# Patient Record
Sex: Male | Born: 1985 | Race: Black or African American | Hispanic: No | Marital: Single | Smoking: Former smoker
Health system: Southern US, Community
[De-identification: ages and names within clinical notes are randomized; demographics above are authoritative.]

---

## 2010-05-09 ENCOUNTER — Emergency Department (HOSPITAL_COMMUNITY)
Admission: EM | Admit: 2010-05-09 | Discharge: 2010-05-09 | Disposition: A | Discharge status: Other Acute Inpatient Hospital | Payer: Self-pay | Attending: Emergency Medicine | Admitting: Emergency Medicine

## 2010-05-09 DIAGNOSIS — F1420 Cocaine dependence, uncomplicated: Secondary | ICD-10-CM | POA: Insufficient documentation

## 2010-05-09 DIAGNOSIS — T71162A Asphyxiation due to hanging, intentional self-harm, initial encounter: Secondary | ICD-10-CM | POA: Insufficient documentation

## 2010-05-09 DIAGNOSIS — F489 Nonpsychotic mental disorder, unspecified: Secondary | ICD-10-CM | POA: Insufficient documentation

## 2010-05-09 LAB — CBC
HCT: 45.5 % (ref 39.0–52.0)
MCHC: 36.7 g/dL — ABNORMAL HIGH (ref 30.0–36.0)
MCV: 88.2 fL (ref 78.0–100.0)
RDW: 12.2 % (ref 11.5–15.5)

## 2010-05-09 LAB — RAPID URINE DRUG SCREEN, HOSP PERFORMED
Cocaine: POSITIVE — AB
Tetrahydrocannabinol: NOT DETECTED

## 2010-05-09 LAB — BASIC METABOLIC PANEL
BUN: 9 mg/dL (ref 6–23)
CO2: 28 mEq/L (ref 19–32)
Calcium: 9 mg/dL (ref 8.4–10.5)
Creatinine, Ser: 1.26 mg/dL (ref 0.4–1.5)
Glucose, Bld: 96 mg/dL (ref 70–99)

## 2010-05-09 LAB — DIFFERENTIAL
Eosinophils Relative: 0 % (ref 0–5)
Lymphocytes Relative: 26 % (ref 12–46)
Lymphs Abs: 2.1 10*3/uL (ref 0.7–4.0)
Monocytes Absolute: 0.5 10*3/uL (ref 0.1–1.0)

## 2010-07-13 ENCOUNTER — Inpatient Hospital Stay: Payer: Self-pay | Admitting: Unknown Physician Specialty

## 2018-01-15 ENCOUNTER — Other Ambulatory Visit: Payer: Self-pay

## 2018-01-15 ENCOUNTER — Emergency Department (HOSPITAL_COMMUNITY)
Admission: EM | Admit: 2018-01-15 | Discharge: 2018-01-15 | Disposition: A | Discharge status: Home / Self Care | Payer: Self-pay | Attending: Emergency Medicine | Admitting: Emergency Medicine

## 2018-01-15 ENCOUNTER — Encounter (HOSPITAL_COMMUNITY): Payer: Self-pay

## 2018-01-15 ENCOUNTER — Emergency Department (HOSPITAL_COMMUNITY): Payer: Self-pay

## 2018-01-15 DIAGNOSIS — F17290 Nicotine dependence, other tobacco product, uncomplicated: Secondary | ICD-10-CM | POA: Insufficient documentation

## 2018-01-15 DIAGNOSIS — M109 Gout, unspecified: Secondary | ICD-10-CM | POA: Insufficient documentation

## 2018-01-15 LAB — GRAM STAIN: Gram Stain: NONE SEEN

## 2018-01-15 LAB — CBC WITH DIFFERENTIAL/PLATELET
Abs Immature Granulocytes: 0.07 10*3/uL (ref 0.00–0.07)
BASOS ABS: 0 10*3/uL (ref 0.0–0.1)
BASOS PCT: 0 %
EOS ABS: 0 10*3/uL (ref 0.0–0.5)
Eosinophils Relative: 0 %
HCT: 42.4 % (ref 39.0–52.0)
Hemoglobin: 14.4 g/dL (ref 13.0–17.0)
IMMATURE GRANULOCYTES: 1 %
Lymphocytes Relative: 7 %
Lymphs Abs: 1 10*3/uL (ref 0.7–4.0)
MCH: 30.1 pg (ref 26.0–34.0)
MCHC: 34 g/dL (ref 30.0–36.0)
MCV: 88.5 fL (ref 80.0–100.0)
Monocytes Absolute: 1.3 10*3/uL — ABNORMAL HIGH (ref 0.1–1.0)
Monocytes Relative: 10 %
NEUTROS PCT: 82 %
NRBC: 0 % (ref 0.0–0.2)
Neutro Abs: 11 10*3/uL — ABNORMAL HIGH (ref 1.7–7.7)
PLATELETS: 242 10*3/uL (ref 150–400)
RBC: 4.79 MIL/uL (ref 4.22–5.81)
RDW: 12 % (ref 11.5–15.5)
WBC: 13.4 10*3/uL — AB (ref 4.0–10.5)

## 2018-01-15 LAB — BASIC METABOLIC PANEL
ANION GAP: 10 (ref 5–15)
BUN: 9 mg/dL (ref 6–20)
CALCIUM: 8.9 mg/dL (ref 8.9–10.3)
CO2: 26 mmol/L (ref 22–32)
Chloride: 97 mmol/L — ABNORMAL LOW (ref 98–111)
Creatinine, Ser: 1.02 mg/dL (ref 0.61–1.24)
GFR calc Af Amer: >60 mL/min (ref >60)
GLUCOSE: 194 mg/dL — AB (ref 70–99)
Potassium: 3.3 mmol/L — ABNORMAL LOW (ref 3.5–5.1)
SODIUM: 133 mmol/L — AB (ref 135–145)

## 2018-01-15 LAB — SYNOVIAL CELL COUNT + DIFF, W/ CRYSTALS
EOSINOPHILS-SYNOVIAL: 0 % (ref 0–1)
Lymphocytes-Synovial Fld: 2 % (ref 0–20)
MONOCYTE-MACROPHAGE-SYNOVIAL FLUID: 2 % — AB (ref 50–90)
Neutrophil, Synovial: 96 % — ABNORMAL HIGH (ref 0–25)
WBC, Synovial: 82410 /mm3 — ABNORMAL HIGH (ref 0–200)

## 2018-01-15 MED ORDER — LIDOCAINE HCL (PF) 2 % IJ SOLN
INTRAMUSCULAR | Status: AC
  Administered 2018-01-15: 5 mL
  Filled 2018-01-15: qty 10

## 2018-01-15 MED ORDER — COLCHICINE 0.6 MG PO TABS: 0.6000 mg | ORAL_TABLET | Freq: Four times a day (QID) | ORAL | 1 refills | Status: DC

## 2018-01-15 MED ORDER — PREDNISONE 50 MG PO TABS
60.0000 mg | ORAL_TABLET | Freq: Once | ORAL | Status: AC
  Administered 2018-01-15: 60 mg via ORAL
  Filled 2018-01-15: qty 1

## 2018-01-15 MED ORDER — PREDNISONE 20 MG PO TABS: 40.0000 mg | ORAL_TABLET | Freq: Every day | ORAL | 0 refills | Status: AC

## 2018-01-15 MED ORDER — SODIUM CHLORIDE 0.9 % IV BOLUS
1000.0000 mL | Freq: Once | INTRAVENOUS | Status: AC
  Administered 2018-01-15: 1000 mL via INTRAVENOUS

## 2018-01-15 MED ORDER — LIDOCAINE HCL (PF) 2 % IJ SOLN
2.0000 mL | Freq: Once | INTRAMUSCULAR | Status: DC
Start: 2018-01-15 — End: 2018-01-15

## 2018-01-15 NOTE — ED Provider Notes (Signed)
[facility] EMERGENCY DEPARTMENT Provider Note   CSN: [id number] Arrival date & time: 01/15/18  1007     History   Chief Complaint Chief Complaint  Patient presents with  . Knee Pain    HPI Tommy Diaz is a 32 y.o. male.  HPI  Patient presents with left knee pain.  Left knee pain began after buckling while at work 3 days ago.  Not initially swollen but a few hours later swell up with increased pain.  Some chills yesterday.  Swelling of the knee without other injury.  No chest pain.  No cough.  Found to have a fever here.  No sore throat.  No dysuria.  No sick contacts.  Denies penile discharge.  He is otherwise healthy.  History reviewed. No pertinent past medical history.  There are no active problems to display for this patient.   History reviewed. No pertinent surgical history.      Home Medications    Prior to Admission medications   Medication Sig Start Date End Date Taking? Authorizing Provider  acetaminophen (TYLENOL) 500 MG tablet Take 1,000 mg by mouth every 6 (six) hours as needed for mild pain or headache.   Yes [provider]  colchicine 0.6 MG tablet Take 1 tablet (0.6 mg total) by mouth 4 (four) times daily. 4 times a day until diarrhea then stop medication. 01/15/18   Benjiman Core, MD  predniSONE (DELTASONE) 20 MG tablet Take 2 tablets (40 mg total) by mouth daily. 01/16/18   Benjiman Core, MD    Family History No family history on file.  Social History Social History   Tobacco Use  . Smoking status: Current Every Day Smoker  . Smokeless tobacco: Current User    Types: Chew  Substance Use Topics  . Alcohol use: Yes    Comment: 4 - 8oz per week  . Drug use: Not Currently     Allergies   Patient has no known allergies.   Review of Systems Review of Systems  Constitutional: Positive for chills and fever. Negative for appetite change.  HENT: Negative for congestion and sore throat.   Respiratory: Negative for cough and  shortness of breath.   Cardiovascular: Negative for chest pain.  Gastrointestinal: Negative for abdominal pain, nausea and vomiting.  Genitourinary: Negative for flank pain.  Musculoskeletal: Positive for joint swelling. Negative for back pain.       Left knee pain  Neurological: Negative for weakness.     Physical Exam Updated Vital Signs BP (!) 153/92 (BP Location: Left Arm)   Pulse (!) 107   Temp (!) 101.7 F (38.7 C) (Oral)   Resp 20   Ht 6\' 1"  (1.854 m)   Wt 83.9 kg   SpO2 99%   BMI 24.41 kg/m   Physical Exam  Constitutional: He appears well-developed.  HENT:  Head: Atraumatic.  Mouth/Throat: No oropharyngeal exudate.  Eyes: Pupils are equal, round, and reactive to light.  Neck: Neck supple.  Cardiovascular:  Mild tachycardia  Pulmonary/Chest: Effort normal. He has no wheezes. He has no rales.  Abdominal: There is no tenderness.  Musculoskeletal: He exhibits tenderness.  Tenderness and swelling of the left knee.  Has effusion.  Has warmth without skin changes.  Decreased range of motion due to pain.  Somewhat irritable knee.  Neurological: He is alert.  Skin: Skin is warm.     ED Treatments / Results  Labs (all labs ordered are listed, but only abnormal results are displayed) Labs Reviewed  CBC  WITH DIFFERENTIAL/PLATELET - Abnormal; Notable for the following components:      Result Value   WBC 13.4 (*)    Neutro Abs 11.0 (*)    Monocytes Absolute 1.3 (*)    All other components within normal limits  BASIC METABOLIC PANEL - Abnormal; Notable for the following components:   Sodium 133 (*)    Potassium 3.3 (*)    Chloride 97 (*)    Glucose, Bld 194 (*)    All other components within normal limits  SYNOVIAL CELL COUNT + DIFF, W/ CRYSTALS - Abnormal; Notable for the following components:   Appearance-Synovial CLOUDY (*)    WBC, Synovial 82,410 (*)    Neutrophil, Synovial 96 (*)    Monocyte-Macrophage-Synovial Fluid 2 (*)    All other components within  normal limits  GRAM STAIN  CULTURE, BODY FLUID-BOTTLE    EKG None  Radiology Dg Chest 2 View  Result Date: 01/15/2018 CLINICAL DATA:  Left knee pain and swelling after an injury yesterday. The patient has unexplained fever and shortness of breath. Current smoker. EXAM: CHEST - 2 VIEW COMPARISON:  None. FINDINGS: The lungs are adequately inflated and clear. The heart and pulmonary vascularity are normal. The mediastinum is normal in width. There is no pleural effusion. The bony thorax exhibits no acute abnormality. IMPRESSION: There is no active cardiopulmonary disease. Electronically Signed   By: David  [place] M.D.   On: 01/15/2018 11:44   Dg Knee Complete 4 Views Left  Result Date: 01/15/2018 CLINICAL DATA:  Left knee pain and fever since an injury at work yesterday. EXAM: LEFT KNEE - COMPLETE 4+ VIEW COMPARISON:  None. FINDINGS: The bones are subjectively adequately mineralized. There is no acute fracture nor dislocation. There is a suprapatellar effusion. There is no high-grade joint space loss. There are no abnormal soft tissue calcifications. IMPRESSION: There is a suprapatellar effusion. There is no acute or significant chronic bony abnormality of the left knee. Electronically Signed   By: David  [place] M.D.   On: 01/15/2018 11:45    Procedures .Joint Aspiration/Arthrocentesis Date/Time: 01/15/2018 12:17 PM Performed by: Benjiman Core, MD Authorized by: Benjiman Core, MD   Consent:    Consent obtained:  Verbal   Consent given by:  Patient   Risks discussed:  Bleeding, infection, pain and incomplete drainage   Alternatives discussed:  No treatment and delayed treatment Location:    Location:  Knee   Knee:  L knee Anesthesia (see MAR for exact dosages):    Anesthesia method:  Local infiltration   Local anesthetic:  Lidocaine 2% w/o epi Procedure details:    Preparation: Patient was prepped and draped in usual sterile fashion     Needle gauge:  18 G   Ultrasound  guidance: yes     Approach:  Lateral   Aspirate amount:  23 cc   Aspirate characteristics:  Cloudy   Steroid injected: no     Specimen collected: yes   Post-procedure details:    Dressing:  Sterile dressing   Patient tolerance of procedure:  Tolerated well, no immediate complications   (including critical care time)  Medications Ordered in ED Medications  lidocaine (XYLOCAINE) 2 % injection 2 mL (2 mLs Other Not Given 01/15/18 1105)  sodium chloride 0.9 % bolus 1,000 mL (0 mLs Intravenous Stopped 01/15/18 1311)  lidocaine (XYLOCAINE) 2 % injection (5 mLs  Given 01/15/18 1105)  predniSONE (DELTASONE) tablet 60 mg (60 mg Oral Given 01/15/18 1318)     Initial Impression / Assessment  and Plan / ED Course  I have reviewed the triage vital signs and the nursing notes.  Pertinent labs & imaging results that were available during my care of the patient were reviewed by me and considered in my medical decision making (see chart for details).     Patient with fever.  Has apparent gout in left knee.  Discussed with Dr. Romeo Apple.  No organisms seen on Gram stain.  Likely gout causing the fever.  Will treat with steroids and colchicine.  Follow-up as an outpatient with Dr. Romeo Apple.  Culture has however been sent.  Final Clinical Impressions(s) / ED Diagnoses   Final diagnoses:  Acute gout of left knee, unspecified cause    ED Discharge Orders         Ordered    predniSONE (DELTASONE) 20 MG tablet  Daily     01/15/18 1259    colchicine 0.6 MG tablet  4 times daily     01/15/18 1259           Benjiman Core, MD 01/15/18 1537

## 2018-01-15 NOTE — ED Notes (Signed)
Pt had 23 cc of cloudy yellow fluid from his left knee by dr Carley Hammed

## 2018-01-15 NOTE — ED Triage Notes (Addendum)
Pt reports he was at work Friday and left knee buckled. Reports knee has been swollen and painful since. Pt noted to have fever in triage. Reports chills yesterday, but no other symptoms. Denies being around anyone sick

## 2018-01-20 LAB — CULTURE, BODY FLUID W GRAM STAIN -BOTTLE: Culture: NO GROWTH

## 2018-07-04 ENCOUNTER — Emergency Department (HOSPITAL_COMMUNITY)
Admission: EM | Admit: 2018-07-04 | Discharge: 2018-07-04 | Disposition: A | Discharge status: Home / Self Care | Payer: Self-pay | Attending: Emergency Medicine | Admitting: Emergency Medicine

## 2018-07-04 ENCOUNTER — Other Ambulatory Visit: Payer: Self-pay

## 2018-07-04 ENCOUNTER — Encounter (HOSPITAL_COMMUNITY): Payer: Self-pay | Admitting: Emergency Medicine

## 2018-07-04 DIAGNOSIS — Z7689 Persons encountering health services in other specified circumstances: Secondary | ICD-10-CM | POA: Insufficient documentation

## 2018-07-04 DIAGNOSIS — M545 Low back pain: Secondary | ICD-10-CM | POA: Insufficient documentation

## 2018-07-04 DIAGNOSIS — Z79899 Other long term (current) drug therapy: Secondary | ICD-10-CM | POA: Insufficient documentation

## 2018-07-04 DIAGNOSIS — Z87891 Personal history of nicotine dependence: Secondary | ICD-10-CM | POA: Insufficient documentation

## 2018-07-04 DIAGNOSIS — Z7982 Long term (current) use of aspirin: Secondary | ICD-10-CM | POA: Insufficient documentation

## 2018-07-04 NOTE — ED Triage Notes (Signed)
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

## 2018-07-04 NOTE — ED Provider Notes (Signed)
[facility] EMERGENCY DEPARTMENT Provider Note   CSN: [id number] Arrival date & time: 07/04/18  1455    History   Chief Complaint Chief Complaint  Patient presents with  . Back Pain    HPI Yasir Bieger is a 33 y.o. male.     HPI   Keshun Corneau is a 33 y.o. male who presents to the Emergency Department requesting to return to work note.  He states that he injured his left upper to mid back two days ago while lifting a lawnmower.  He states that his back pain has improved and he states that he feels like he can perform his daily work requirements.    History reviewed. No pertinent past medical history.  There are no active problems to display for this patient.   History reviewed. No pertinent surgical history.     Home Medications    Prior to Admission medications   Medication Sig Start Date End Date Taking? Authorizing Provider  aspirin EC 81 MG tablet Take 81 mg by mouth every 4 (four) hours as needed.   Yes [provider]  acetaminophen (TYLENOL) 500 MG tablet Take 1,000 mg by mouth every 6 (six) hours as needed for mild pain or headache.    [provider]  colchicine 0.6 MG tablet Take 1 tablet (0.6 mg total) by mouth 4 (four) times daily. 4 times a day until diarrhea then stop medication. 01/15/18   Benjiman Core, MD  predniSONE (DELTASONE) 20 MG tablet Take 2 tablets (40 mg total) by mouth daily. 01/16/18   Benjiman Core, MD    Family History No family history on file.  Social History Social History   Tobacco Use  . Smoking status: Former Games developer  . Smokeless tobacco: Current User    Types: Chew  Substance Use Topics  . Alcohol use: Yes    Comment: 4 - 8oz per week  . Drug use: Not Currently     Allergies   Patient has no known allergies.   Review of Systems Review of Systems  Constitutional: Negative for chills and fever.  Respiratory: Negative for shortness of breath.   Gastrointestinal: Negative for abdominal pain,  constipation, diarrhea and vomiting.  Genitourinary: Negative for decreased urine volume, difficulty urinating, dysuria, flank pain and hematuria.  Musculoskeletal: Negative for arthralgias, back pain and joint swelling.  Skin: Negative for color change, rash and wound.  Neurological: Negative for weakness and numbness.     Physical Exam Updated Vital Signs BP 140/84   Pulse 96   Temp 97.8 F (36.6 C) (Oral)   Resp 16   Ht 5\' 5"  (1.651 m)   Wt 83.9 kg   SpO2 98%   BMI 30.79 kg/m   Physical Exam Vitals signs and nursing note reviewed.  Constitutional:      General: He is not in acute distress.    Appearance: Normal appearance. He is well-developed.  HENT:     Head: Atraumatic.  Neck:     Musculoskeletal: Normal range of motion. No muscular tenderness.  Cardiovascular:     Rate and Rhythm: Normal rate and regular rhythm.     Pulses: Normal pulses.     Comments: DP pulses are strong and palpable bilaterally Pulmonary:     Effort: Pulmonary effort is normal. No respiratory distress.     Breath sounds: Normal breath sounds.  Abdominal:     General: There is no distension.     Palpations: Abdomen is soft.     Tenderness: There  is no abdominal tenderness.  Musculoskeletal:        General: No tenderness.     Lumbar back: He exhibits pain. He exhibits normal range of motion, no swelling, no deformity, no laceration and normal pulse.     Comments: Pt has mild ttp along the left scapular border.  No edema.  No spinal tenderness.  Neg SLR bilaterally, no motor weakness of the bilateral UE's  Skin:    General: Skin is warm.     Capillary Refill: Capillary refill takes less than 2 seconds.     Findings: No rash.  Neurological:     General: No focal deficit present.     Mental Status: He is alert.     Sensory: No sensory deficit.     Motor: No abnormal muscle tone.     Coordination: Coordination normal.     Gait: Gait normal.     Deep Tendon Reflexes:     Reflex Scores:       Patellar reflexes are 2+ on the right side and 2+ on the left side.      Achilles reflexes are 2+ on the right side and 2+ on the left side.     ED Treatments / Results  Labs (all labs ordered are listed, but only abnormal results are displayed) Labs Reviewed - No data to display  EKG None  Radiology No results found.  Procedures Procedures (including critical care time)  Medications Ordered in ED Medications - No data to display   Initial Impression / Assessment and Plan / ED Course  I have reviewed the triage vital signs and the nursing notes.  Pertinent labs & imaging results that were available during my care of the patient were reviewed by me and considered in my medical decision making (see chart for details).        Pt here for return to work note.  NV intact, no motor or sensory deficits.   Return to work note provided for return on 07/05/18  Final Clinical Impressions(s) / ED Diagnoses   Final diagnoses:  Return to work evaluation    ED Discharge Orders    None       Pauline Aus, [place] 07/04/18 1603    Samuel Jester, DO 07/07/18 1554

## 2018-07-04 NOTE — ED Notes (Signed)
ED Provider at bedside. 

## 2018-07-04 NOTE — Discharge Instructions (Addendum)
You can try taking ibuprofen 600 mg 3 times a day if needed.  Return to ER if needed

## 2018-07-04 NOTE — ED Notes (Signed)
Pt states he hurt his back on 03/30 and was not evaluated at that time, was told today he can not return to work until he is cleared.

## 2019-08-27 ENCOUNTER — Emergency Department (HOSPITAL_COMMUNITY)
Admission: EM | Admit: 2019-08-27 | Discharge: 2019-08-28 | Disposition: A | Discharge status: Home / Self Care | Payer: Self-pay | Attending: Emergency Medicine | Admitting: Emergency Medicine

## 2019-08-27 ENCOUNTER — Encounter (HOSPITAL_COMMUNITY): Payer: Self-pay

## 2019-08-27 ENCOUNTER — Emergency Department (HOSPITAL_COMMUNITY): Payer: Self-pay

## 2019-08-27 ENCOUNTER — Other Ambulatory Visit: Payer: Self-pay

## 2019-08-27 DIAGNOSIS — Z79899 Other long term (current) drug therapy: Secondary | ICD-10-CM | POA: Insufficient documentation

## 2019-08-27 DIAGNOSIS — M109 Gout, unspecified: Secondary | ICD-10-CM | POA: Insufficient documentation

## 2019-08-27 DIAGNOSIS — Z87891 Personal history of nicotine dependence: Secondary | ICD-10-CM | POA: Insufficient documentation

## 2019-08-27 NOTE — ED Triage Notes (Signed)
Pt presents to ED with complaints of left wrist pain since yesterday. Pt with no known injury.

## 2019-08-28 LAB — URIC ACID: Uric Acid, Serum: 9.1 mg/dL — ABNORMAL HIGH (ref 3.7–8.6)

## 2019-08-28 MED ORDER — KETOROLAC TROMETHAMINE 30 MG/ML IJ SOLN
30.0000 mg | Freq: Once | INTRAMUSCULAR | Status: AC
  Administered 2019-08-28: 30 mg via INTRAMUSCULAR
  Filled 2019-08-28: qty 1

## 2019-08-28 MED ORDER — COLCHICINE 0.6 MG PO TABS
0.6000 mg | ORAL_TABLET | Freq: Once | ORAL | Status: AC
  Administered 2019-08-28: 0.6 mg via ORAL
  Filled 2019-08-28: qty 1

## 2019-08-28 MED ORDER — COLCHICINE 0.6 MG PO TABS: 0.6000 mg | ORAL_TABLET | Freq: Every day | ORAL | 1 refills | Status: AC

## 2019-08-28 NOTE — Discharge Instructions (Addendum)
Your uric acid level which is a sign of gout is mildly elevated at 9.1.  Please take the medications as prescribed.  Also take ibuprofen 600 mg 4 times a day for the pain.  Wear the splint for comfort.  Please try to avoid drinking alcohol and look at the low purine diet to help stop you getting these attacks of gout.  You should consider getting a primary care doctor or at least following up with an orthopedist.  Dr. Romeo Apple is the orthopedist in [place].  Call his office to get an appointment.

## 2019-08-28 NOTE — ED Provider Notes (Signed)
[facility] EMERGENCY DEPARTMENT Provider Note   CSN: [id number] Arrival date & time: 08/27/19  2241   Time seen 11:53 PM  History Chief Complaint  Patient presents with   Wrist Pain    Tommy Diaz is a 34 y.o. male.  HPI   Patient states he woke up on the morning of the 23rd and he was having some pain in his left wrist.  He denies any known injury although he states he did drink heavily the night before.  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  He states he has a lot of pain especially on the ulnar aspect of his left wrist.  Patient is left-handed.  He states he is never had this before.  In retrospect after we talked he did state he had gout in his right knee a few months ago.  PCP Patient, No Pcp Per   History reviewed. No pertinent past medical history.  There are no problems to display for this patient.   History reviewed. No pertinent surgical history.     No family history on file.  Social History   Tobacco Use   Smoking status: Former Smoker   Smokeless tobacco: Current User    Types: Chew  Substance Use Topics   Alcohol use: Yes    Comment: 4 - 8oz per week   Drug use: Not Currently  employed  Home Medications Prior to Admission medications   Medication Sig Start Date End Date Taking? Authorizing Provider  acetaminophen (TYLENOL) 500 MG tablet Take 1,000 mg by mouth every 6 (six) hours as needed for mild pain or headache.    [provider]  aspirin EC 81 MG tablet Take 81 mg by mouth every 4 (four) hours as needed.    [provider]  colchicine 0.6 MG tablet Take 1 tablet (0.6 mg total) by mouth daily. 08/28/19   Devoria Albe, MD  predniSONE (DELTASONE) 20 MG tablet Take 2 tablets (40 mg total) by mouth daily. 01/16/18   Benjiman Core, MD    Allergies    Patient has no known allergies.  Review of Systems   Review of Systems  All other systems reviewed and are  negative.   Physical Exam Updated Vital Signs BP 136/77 (BP Location: Right Arm)    Pulse (!) 101    Temp 98.1 F (36.7 C) (Oral)    Resp 18    Ht 5\' 5"  (1.651 m)    Wt 88.5 kg    SpO2 95%    BMI 32.45 kg/m   Physical Exam Vitals and nursing note reviewed.  Constitutional:      Appearance: Normal appearance. He is normal weight.  HENT:     Head: Normocephalic and atraumatic.  Eyes:     Extraocular Movements: Extraocular movements intact.     Conjunctiva/sclera: Conjunctivae normal.  Cardiovascular:     Rate and Rhythm: Normal rate.  Pulmonary:     Effort: Pulmonary effort is normal. No respiratory distress.  Musculoskeletal:        General: Swelling present.     Cervical back: Normal range of motion.     Comments: Patient is noted to have some mild diffuse swelling of his left wrist.  He has pain on the ulnar aspect on dorsiflexion and extension and with supination.  When I feel his wrist there is warmth of the wrist and hand.  He has good distal pulses and capillary refill.  Skin:    General: Skin is warm and dry.  Neurological:     General: No focal deficit present.     Mental Status: He is alert and oriented to person, place, and time.     Cranial Nerves: No cranial nerve deficit.  Psychiatric:        Mood and Affect: Mood normal.        Behavior: Behavior normal.     ED Results / Procedures / Treatments   Labs (all labs ordered are listed, but only abnormal results are displayed) Results for orders placed or performed during the hospital encounter of 08/27/19  Uric acid  Result Value Ref Range   Uric Acid, Serum 9.1 (H) 3.7 - 8.6 mg/dL   Laboratory interpretation all normal except elevated uric acid    EKG None  Radiology DG Wrist Complete Left  Result Date: 08/27/2019 CLINICAL DATA:  Wrist pain EXAM: LEFT WRIST - COMPLETE 3+ VIEW COMPARISON:  None. FINDINGS: There is no evidence of fracture or dislocation. There is no evidence of arthropathy or other focal  bone abnormality. Soft tissues are unremarkable. IMPRESSION: Negative. Electronically Signed   By: Donavan Foil M.D.   On: 08/27/2019 23:24    Procedures Procedures (including critical care time)  Medications Ordered in ED Medications  colchicine tablet 0.6 mg (has no administration in time range)  ketorolac (TORADOL) 30 MG/ML injection 30 mg (30 mg Intramuscular Given 08/28/19 0011)    ED Course  I have reviewed the triage vital signs and the nursing notes.  Pertinent labs & imaging results that were available during my care of the patient were reviewed by me and considered in my medical decision making (see chart for details).    MDM Rules/Calculators/A&P                      During my exam I noted his wrist felt very warm.  At that point patient remembered he had had had gout in his knee before.  When I review his charts he has not had a uric acid done, he is agreeable to having it done tonight.  While we were waiting he got Toradol IM.  He was also placed in a wrist splint.  Patient does admit to drinking heavily the night before his wrist started hurting.  We discussed that he needs to avoid drinking alcohol and he needs to look at the low purine diet to help avoid gout attacks.  His uric acid level was mildly elevated, at this point I am not sure if he needs to be on allopurinol chronically.  However patient does not have a primary care doctor.  He was advised to get a primary care doctor or follow-up with Dr. Aline Brochure, orthopedist.   Final Clinical Impression(s) / ED Diagnoses Final diagnoses:  Acute gout of left wrist, unspecified cause    Rx / DC Orders ED Discharge Orders         Ordered    colchicine 0.6 MG tablet  Daily     08/28/19 0111        OTC ibuprofen   Plan discharge  Rolland Porter, MD, Barbette Or, MD 08/28/19 [phone]

## 2019-09-23 ENCOUNTER — Emergency Department (HOSPITAL_COMMUNITY): Payer: Self-pay

## 2019-09-23 ENCOUNTER — Other Ambulatory Visit: Payer: Self-pay

## 2019-09-23 DIAGNOSIS — Y929 Unspecified place or not applicable: Secondary | ICD-10-CM | POA: Insufficient documentation

## 2019-09-23 DIAGNOSIS — X500XXA Overexertion from strenuous movement or load, initial encounter: Secondary | ICD-10-CM | POA: Insufficient documentation

## 2019-09-23 DIAGNOSIS — Z7982 Long term (current) use of aspirin: Secondary | ICD-10-CM | POA: Insufficient documentation

## 2019-09-23 DIAGNOSIS — M7021 Olecranon bursitis, right elbow: Secondary | ICD-10-CM | POA: Insufficient documentation

## 2019-09-23 DIAGNOSIS — Z87891 Personal history of nicotine dependence: Secondary | ICD-10-CM | POA: Insufficient documentation

## 2019-09-23 DIAGNOSIS — Y9389 Activity, other specified: Secondary | ICD-10-CM | POA: Insufficient documentation

## 2019-09-23 DIAGNOSIS — S46811A Strain of other muscles, fascia and tendons at shoulder and upper arm level, right arm, initial encounter: Secondary | ICD-10-CM | POA: Insufficient documentation

## 2019-09-23 DIAGNOSIS — Y990 Civilian activity done for income or pay: Secondary | ICD-10-CM | POA: Insufficient documentation

## 2019-09-23 NOTE — ED Triage Notes (Signed)
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

## 2019-09-24 ENCOUNTER — Emergency Department (HOSPITAL_COMMUNITY)
Admission: EM | Admit: 2019-09-24 | Discharge: 2019-09-24 | Disposition: A | Discharge status: Home / Self Care | Payer: Self-pay | Attending: Emergency Medicine | Admitting: Emergency Medicine

## 2019-09-24 DIAGNOSIS — M7021 Olecranon bursitis, right elbow: Secondary | ICD-10-CM

## 2019-09-24 DIAGNOSIS — S46811A Strain of other muscles, fascia and tendons at shoulder and upper arm level, right arm, initial encounter: Secondary | ICD-10-CM

## 2019-09-24 MED ORDER — NAPROXEN 500 MG PO TABS: ORAL_TABLET | ORAL | 0 refills | Status: AC

## 2019-09-24 MED ORDER — NAPROXEN 250 MG PO TABS
500.0000 mg | ORAL_TABLET | Freq: Once | ORAL | Status: AC
  Administered 2019-09-24: 500 mg via ORAL
  Filled 2019-09-24: qty 2

## 2019-09-24 MED ORDER — METHOCARBAMOL 750 MG PO TABS
ORAL_TABLET | ORAL | 0 refills | Status: AC
Start: 2019-09-24 — End: ?

## 2019-09-24 NOTE — Discharge Instructions (Signed)
Use ice and heat for comfort.  Take the medication as prescribed.  Please call Dr. Mort Sawyers office to be rechecked.

## 2019-09-24 NOTE — ED Provider Notes (Signed)
[facility] EMERGENCY DEPARTMENT Provider Note   CSN: [id number] Arrival date & time: 09/23/19  2117   Time seen 12:40 AM  History Chief Complaint  Patient presents with  . Shoulder Injury    left shoulder right elbow    Tommy Diaz is a 34 y.o. male.  HPI   Patient states he started working this job about a month ago.  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  He states he does this 8-10 times a night when he works.  He has been seen in the ED on May 25 for left wrist pain from doing that job.  He states because his left wrist hurts he was putting more stress on his right elbow which started hurting 2 to 3 days ago.  He states it hurts when he flexes his elbow.  He also states he has been having some pain in his left shoulder area for about a week and it hurts when he abducts his left arm.  Patient denies any numbness of his fingers.  Patient is left-handed.  PCP Patient, No Pcp Per   No past medical history on file.  There are no problems to display for this patient.   No past surgical history on file.     No family history on file.  Social History   Tobacco Use  . Smoking status: Former Games developer  . Smokeless tobacco: Current User    Types: Chew  Substance Use Topics  . Alcohol use: Yes    Comment: 4 - 8oz per week  . Drug use: Not Currently  employed  Home Medications Prior to Admission medications   Medication Sig Start Date End Date Taking? Authorizing Provider  acetaminophen (TYLENOL) 500 MG tablet Take 1,000 mg by mouth every 6 (six) hours as needed for mild pain or headache.    [provider]  aspirin EC 81 MG tablet Take 81 mg by mouth every 4 (four) hours as needed.    [provider]  colchicine 0.6 MG tablet Take 1 tablet (0.6 mg total) by mouth daily. 08/28/19   Devoria Albe, MD  methocarbamol (ROBAXIN) 750 MG tablet Take  1 or 2 po Q 6hrs for muscle pain 09/24/19   Devoria Albe, MD  naproxen (NAPROSYN) 500 MG tablet Take 1 po BID with food prn pain 09/24/19   Devoria Albe, MD  predniSONE (DELTASONE) 20 MG tablet Take 2 tablets (40 mg total) by mouth daily. 01/16/18   Benjiman Core, MD    Allergies    Patient has no known allergies.  Review of Systems   Review of Systems  All other systems reviewed and are negative.   Physical Exam Updated Vital Signs BP (!) 139/92 (BP Location: Right Arm)   Pulse (!) 105   Temp 98.9 F (37.2 C) (Oral)   Resp 18   Ht 5\' 5"  (1.651 m)   Wt 90.7 kg   SpO2 97%   BMI 33.28 kg/m   Physical Exam Vitals and nursing note reviewed.  Constitutional:      Appearance: Normal appearance. He is normal weight.  HENT:     Head: Normocephalic and atraumatic.  Eyes:     Conjunctiva/sclera: Conjunctivae normal.  Neck:   Cardiovascular:     Rate and Rhythm: Normal rate.  Pulmonary:     Effort: Pulmonary effort is normal. No respiratory  distress.  Musculoskeletal:     Cervical back: Normal range of motion.     Comments: When I get examined patient's left shoulder he is nontender to palpation over the left clavicle, he is nontender to palpation in the left shoulder joint.  However where he is tender is in the midportion of his left trapezius muscle between his neck and his shoulder.  It is very painful when he abducts his arm although abduction is intact.  He has good distal pulses.  Grips are equal.  When I examined his left elbow there is some mild diffuse soft tissue swelling present.  I do not appreciate an effusion.  He has no pain on supination and pronation but flexion of his elbow is uncomfortable.  He has good distal pulses.  Skin:    General: Skin is warm and dry.  Neurological:     General: No focal deficit present.     Mental Status: He is alert and oriented to person, place, and time.     Cranial Nerves: No cranial nerve deficit.  Psychiatric:        Mood and  Affect: Mood normal.        Behavior: Behavior normal.        Thought Content: Thought content normal.     ED Results / Procedures / Treatments   Labs (all labs ordered are listed, but only abnormal results are displayed) Labs Reviewed - No data to display  EKG None  Radiology DG Elbow Complete Right  Result Date: 09/23/2019 CLINICAL DATA:  Status post trauma. EXAM: RIGHT ELBOW - COMPLETE 3+ VIEW COMPARISON:  None. FINDINGS: There is no evidence of fracture, dislocation, or joint effusion. There is no evidence of arthropathy or other focal bone abnormality. Soft tissues are unremarkable. IMPRESSION: Negative. Electronically Signed   By: Virgina Norfolk M.D.   On: 09/23/2019 22:01   DG Shoulder Left  Result Date: 09/23/2019 CLINICAL DATA:  34 year old male with trauma to the left shoulder. EXAM: LEFT SHOULDER - 2+ VIEW COMPARISON:  None. FINDINGS: There is no evidence of fracture or dislocation. There is no evidence of arthropathy or other focal bone abnormality. Soft tissues are unremarkable. IMPRESSION: Negative. Electronically Signed   By: Anner Crete M.D.   On: 09/23/2019 22:01    Procedures Procedures (including critical care time)  Medications Ordered in ED Medications  naproxen (NAPROSYN) tablet 500 mg (has no administration in time range)    ED Course  I have reviewed the triage vital signs and the nursing notes.  Pertinent labs & imaging results that were available during my care of the patient were reviewed by me and considered in my medical decision making (see chart for details).    MDM Rules/Calculators/A&P                          Patient appears to have some left trapezius muscle strain and possibly bursitis of his right elbow.  He was discharged home with a nonsteroidal anti-inflammatory drug and a muscle relaxer.  He is advised to use ice and heat.  He was referred to the local orthopedist in town.  He was given a work note for today.   Final  Clinical Impression(s) / ED Diagnoses Final diagnoses:  Strain of right trapezius muscle, initial encounter  Olecranon bursitis of right elbow    Rx / DC Orders ED Discharge Orders         Ordered    naproxen (  NAPROSYN) 500 MG tablet     Discontinue  Reprint     09/24/19 0053    methocarbamol (ROBAXIN) 750 MG tablet     Discontinue  Reprint     09/24/19 0053          Plan discharge  Devoria Albe, MD, Concha Pyo, MD 09/24/19 [phone]

## 2021-04-06 IMAGING — DX DG WRIST COMPLETE 3+V*L*
4 series · 4 of 4 positions shown · non-contrast
Comparison: None.

CLINICAL DATA: Wrist pain

EXAM:
LEFT WRIST - COMPLETE 3+ VIEW

[wrist pa]
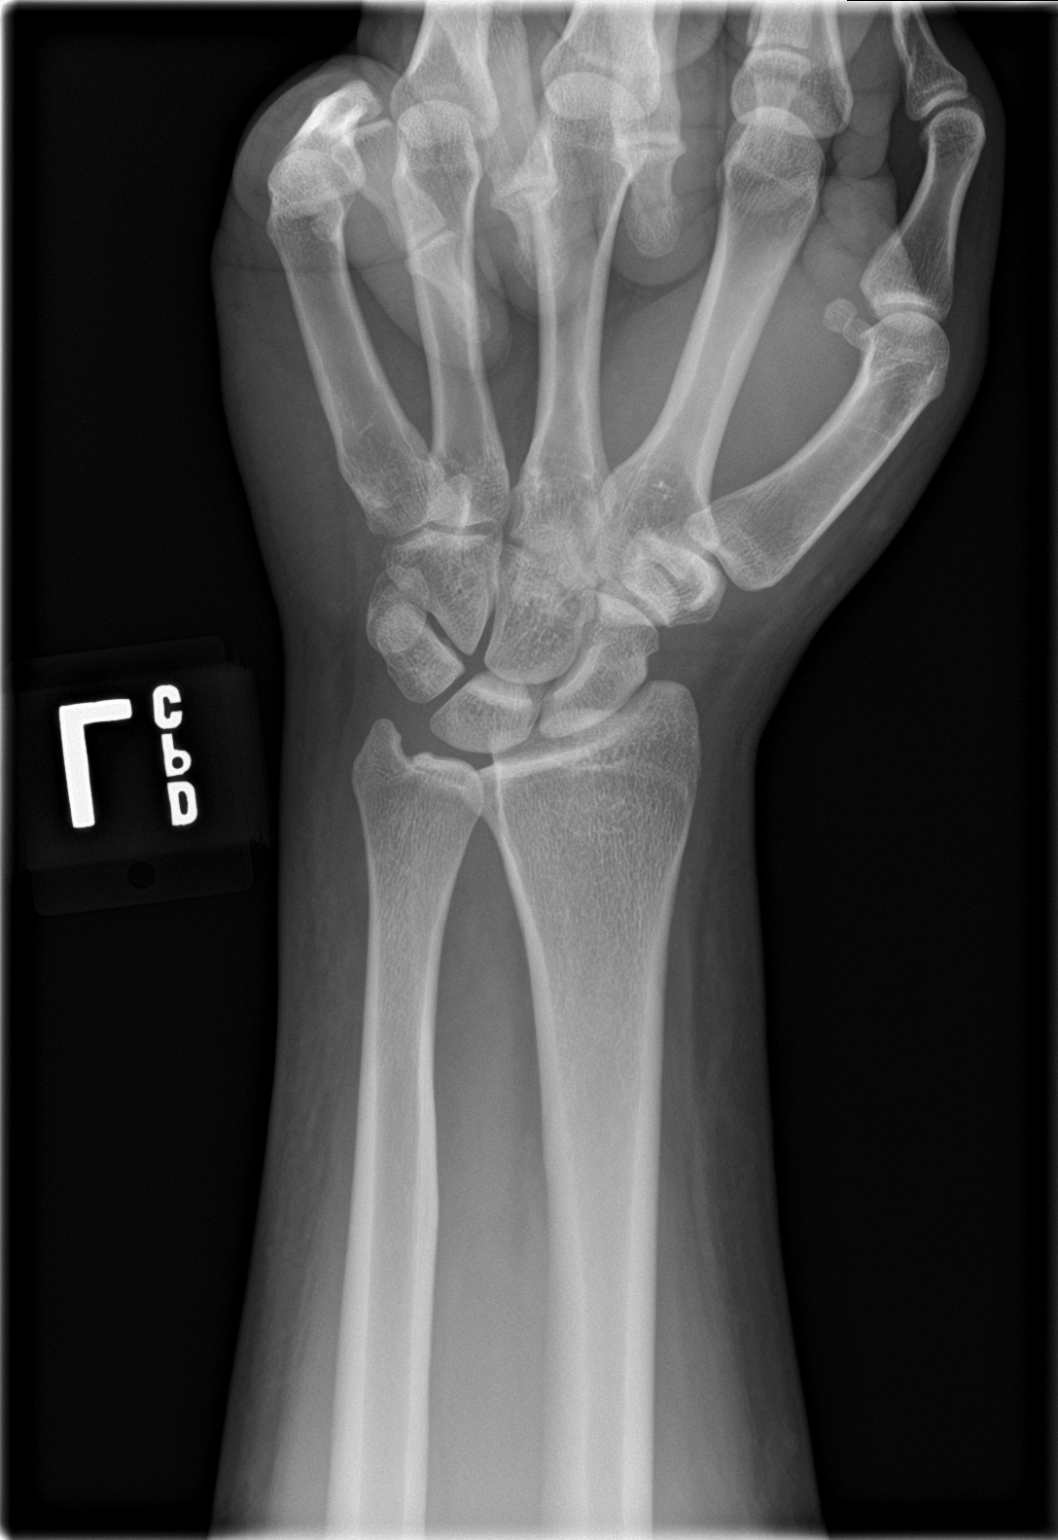

[wrist obl]
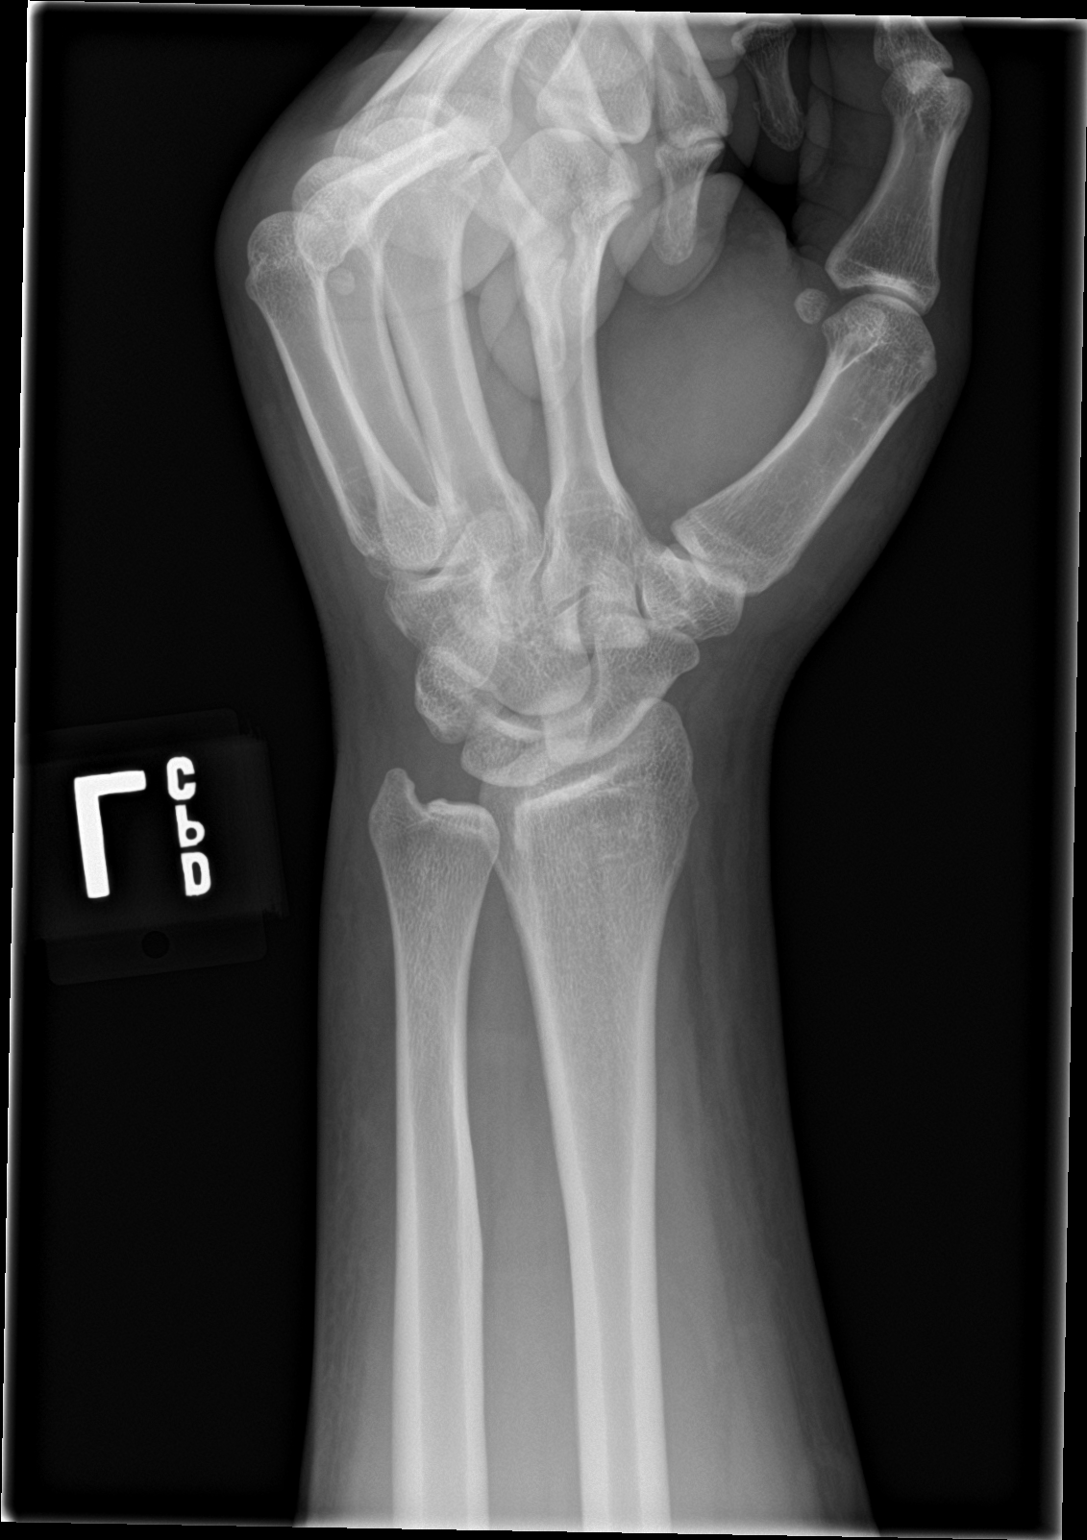

[wrist lat]
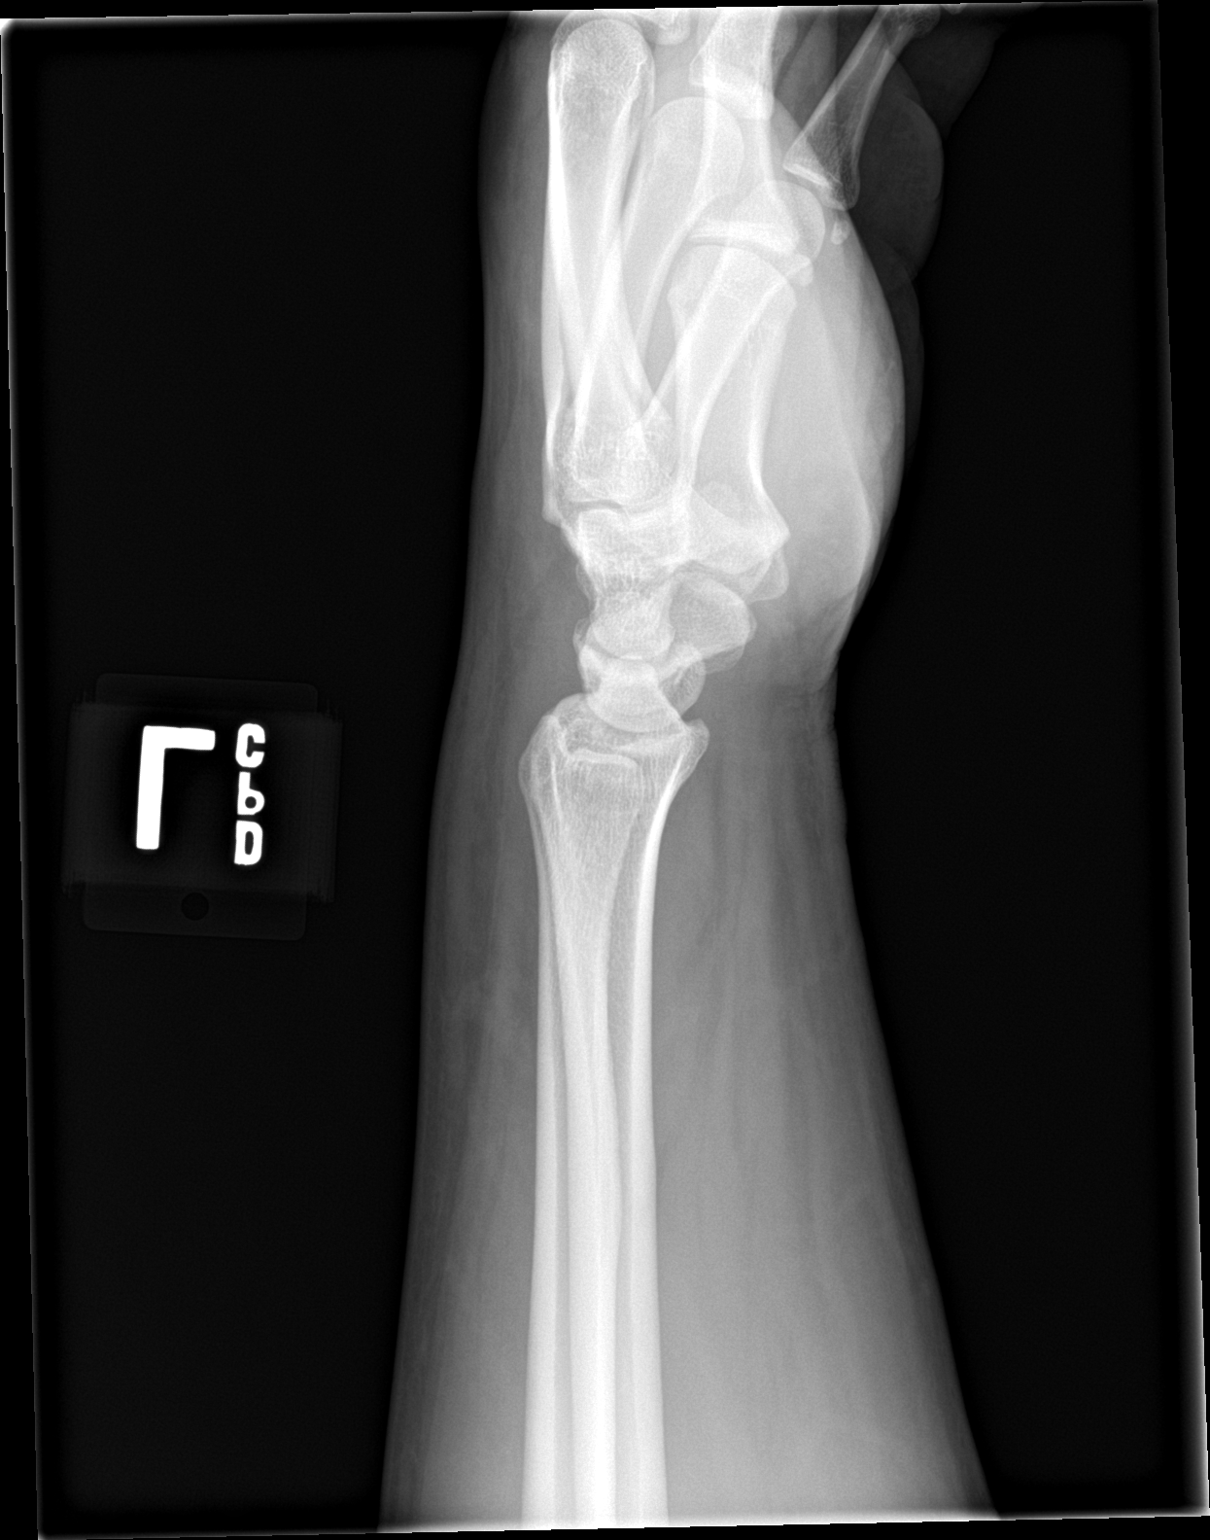

[wrist navicular]
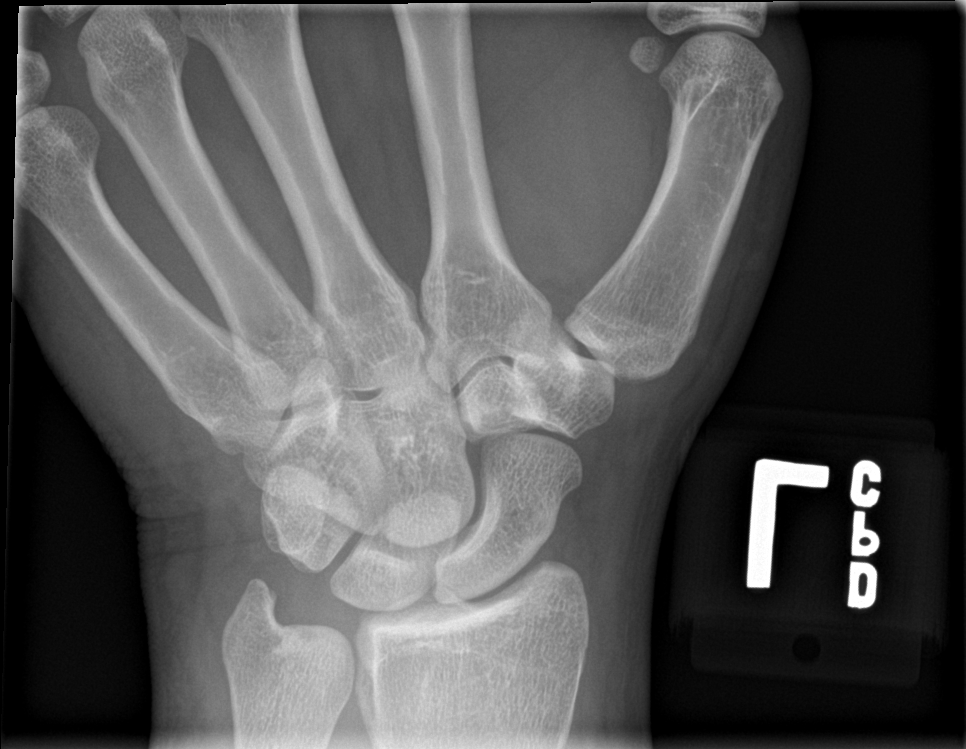

[4 of 4 positions shown; findings below may reference images not displayed]

FINDINGS: There is no evidence of fracture or dislocation. There is no
evidence of arthropathy or other focal bone abnormality. Soft
tissues are unremarkable.
IMPRESSION: Negative.
